# Patient Record
Sex: Male | Born: 1970 | Race: Black or African American | Hispanic: No | Marital: Married | Smoking: Never smoker
Health system: Southern US, Community
[De-identification: ages and names within clinical notes are randomized; demographics above are authoritative.]

## PROBLEM LIST (undated history)

## (undated) DIAGNOSIS — Z973 Presence of spectacles and contact lenses: Secondary | ICD-10-CM

## (undated) DIAGNOSIS — E785 Hyperlipidemia, unspecified: Secondary | ICD-10-CM

## (undated) HISTORY — DX: Presence of spectacles and contact lenses: Z97.3

## (undated) HISTORY — DX: Hyperlipidemia, unspecified: E78.5

## (undated) HISTORY — PX: VASECTOMY: SHX75

---

## 2005-02-25 ENCOUNTER — Emergency Department (HOSPITAL_COMMUNITY): Admission: EM | Admit: 2005-02-25 | Discharge: 2005-02-25 | Payer: Self-pay | Admitting: Emergency Medicine
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
## 2009-02-14 ENCOUNTER — Emergency Department (HOSPITAL_COMMUNITY): Admission: EM | Admit: 2009-02-14 | Discharge: 2009-02-15 | Payer: Self-pay | Admitting: Emergency Medicine
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
## 2013-06-01 ENCOUNTER — Emergency Department (INDEPENDENT_AMBULATORY_CARE_PROVIDER_SITE_OTHER)
Admission: EM | Admit: 2013-06-01 | Discharge: 2013-06-01 | Disposition: A | Discharge status: Home / Self Care | Payer: 59 | Source: Home / Self Care

## 2013-06-01 ENCOUNTER — Encounter (HOSPITAL_COMMUNITY): Payer: Self-pay | Admitting: Emergency Medicine

## 2013-06-01 DIAGNOSIS — S139XXA Sprain of joints and ligaments of unspecified parts of neck, initial encounter: Secondary | ICD-10-CM

## 2013-06-01 DIAGNOSIS — S161XXA Strain of muscle, fascia and tendon at neck level, initial encounter: Secondary | ICD-10-CM

## 2013-06-01 MED ORDER — CYCLOBENZAPRINE HCL 10 MG PO TABS: 10.0000 mg | ORAL_TABLET | Freq: Two times a day (BID) | ORAL | Status: DC | PRN

## 2013-06-01 MED ORDER — TRAMADOL HCL 50 MG PO TABS: 50.0000 mg | ORAL_TABLET | Freq: Four times a day (QID) | ORAL | Status: DC | PRN

## 2013-06-01 NOTE — ED Provider Notes (Signed)
Medical screening examination/treatment/procedure(s) were performed by non-physician practitioner and as supervising physician I was immediately available for consultation/collaboration.  Ranette Luckadoo, M.D.  Deklin Bieler C Jesiel Garate, MD 06/01/13 2213 

## 2013-06-01 NOTE — ED Notes (Signed)
Pain in neck for 1 week. No known cause of pain. Pain comes and goes, worse when tries to move neck. Has taken some medication that was left over from another problem, unsure of med name

## 2013-06-01 NOTE — ED Provider Notes (Signed)
CSN: [id number]     Arrival date & time 06/01/13  1316 History   None    Chief Complaint  Patient presents with  . Neck Pain   (Consider location/radiation/quality/duration/timing/severity/associated sxs/prior Treatment)  HPI  Since a 43 year old male presenting today with reports of neck pain for approximately one week she denies any known injury. States it has "gotten worse throughout the week".  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 Denies any numbness, weakness, or tingling.  History reviewed. No pertinent past medical history. History reviewed. No pertinent past surgical history. History reviewed. No pertinent family history. History  Substance Use Topics  . Smoking status: Never Smoker   . Smokeless tobacco: Not on file  . Alcohol Use: Yes    Review of Systems  Constitutional: Negative.   HENT: Negative.   Eyes: Negative.   Respiratory: Negative.   Cardiovascular: Negative.   Gastrointestinal: Negative.   Endocrine: Negative.   Genitourinary: Negative.   Musculoskeletal: Positive for back pain, myalgias and neck pain. Negative for arthralgias, gait problem, joint swelling and neck stiffness.  Skin: Negative.   Allergic/Immunologic: Negative.   Neurological: Negative.  Negative for dizziness, tremors, weakness, light-headedness, numbness and headaches.  Hematological: Negative.   Psychiatric/Behavioral: Negative.     Allergies  Review of patient's allergies indicates no known allergies.  Home Medications   Current Outpatient Rx  Name  Route  Sig  Dispense  Refill  . cyclobenzaprine (FLEXERIL) 10 MG tablet   Oral   Take 1 tablet (10 mg total) by mouth 2 (two) times daily as needed for muscle spasms.   20 tablet   0   . traMADol (ULTRAM) 50 MG tablet   Oral   Take 1 tablet (50 mg total) by mouth every 6 (six) hours as needed.   15 tablet   0    BP 141/88  Pulse 88  Temp(Src) 98.3 F (36.8 C) (Oral)   Resp 16  SpO2 98%  Physical Exam  Nursing note and vitals reviewed. Constitutional: He is oriented to person, place, and time. He appears well-developed and well-nourished. He appears distressed.  HENT:  Head: Normocephalic and atraumatic.  Eyes: Pupils are equal, round, and reactive to light. Right eye exhibits no discharge. Left eye exhibits no discharge. No scleral icterus.  Neck: Normal range of motion. Neck supple.  No nuchal rigidity.  Cardiovascular: Normal rate, regular rhythm, normal heart sounds and intact distal pulses.  Exam reveals no gallop and no friction rub.   No murmur heard. Pulmonary/Chest: Effort normal and breath sounds normal. No respiratory distress. He has no wheezes. He has no rales. He exhibits no tenderness.  Musculoskeletal: Normal range of motion. He exhibits no edema and no tenderness.       Cervical back: He exhibits normal range of motion, no tenderness, no bony tenderness, no swelling, no edema, no deformity, no laceration, no pain, no spasm and normal pulse.       Back:  Discomfort is noted with movement particularly flexion and extension.  Patient has full active and passive range of motion.  Negative Spurling's.  No discomfort over bony prominences.  Mild tenderness is reported over musculature. Disomfort radiates from neck and bilateral shoulders 2 central back and subscapular area.  Strength 5 / 5 all extremities.  Lymphadenopathy:    He has no cervical adenopathy.  Neurological: He is alert and oriented to person, place, and time. He has normal reflexes. He  displays normal reflexes. No cranial nerve deficit or sensory deficit. He exhibits normal muscle tone. Coordination normal.  Reflex Scores:      Bicep reflexes are 2+ on the right side and 2+ on the left side.      Brachioradialis reflexes are 2+ on the right side and 2+ on the left side.      Patellar reflexes are 2+ on the right side and 2+ on the left side.      Achilles reflexes are 2+ on  the right side and 2+ on the left side. Nerves II through XII grossly intact. Vibratory sensation intact to all extremities. Cap refill less than 2 seconds.   Skin: Skin is warm and dry. No rash noted. He is not diaphoretic. No erythema. No pallor.  Psychiatric: He has a normal mood and affect. His behavior is normal.    ED Course  Procedures (including critical care time) Labs Review Labs Reviewed - No data to display Imaging Review No results found.    MDM   1. Neck muscle strain    Meds ordered this encounter  Medications  . traMADol (ULTRAM) 50 MG tablet    Sig: Take 1 tablet (50 mg total) by mouth every 6 (six) hours as needed.    Dispense:  15 tablet    Refill:  0  . cyclobenzaprine (FLEXERIL) 10 MG tablet    Sig: Take 1 tablet (10 mg total) by mouth 2 (two) times daily as needed for muscle spasms.    Dispense:  20 tablet    Refill:  0   The patient advised to take Aleve twice daily for approximately one week.  Patient show neck stretching exercises and advised to stretch neck and back musculature frequently throughout the day to prevent reoccur ance.  Addition the patient to use ice for comfort. He is in verbalizes understanding of plan of care and followup with specialist or primary care provider if symptoms fail to improve or worsen.     Weber Cooks, NP 06/01/13 1620

## 2013-06-01 NOTE — Discharge Instructions (Signed)
Torticollis, Acute °You have suddenly (acutely) developed a twisted neck (torticollis). This is usually a self-limited condition. °CAUSES  °Acute torticollis may be caused by malposition, trauma or infection. Most commonly, acute torticollis is caused by sleeping in an awkward position. Torticollis may also be caused by the flexion, extension or twisting of the neck muscles beyond their normal position. Sometimes, the exact cause may not be known. °SYMPTOMS  °Usually, there is pain and limited movement of the neck. Your neck may twist to one side. °DIAGNOSIS  °The diagnosis is often made by physical examination. X-rays, CT scans or MRIs may be done if there is a history of trauma or concern of infection. °TREATMENT  °For a common, stiff neck that develops during sleep, treatment is focused on relaxing the contracted neck muscle. Medications (including shots) may be used to treat the problem. Most cases resolve in several days. Torticollis usually responds to conservative physical therapy. If left untreated, the shortened and spastic neck muscle can cause deformities in the face and neck. Rarely, surgery is required. °HOME CARE INSTRUCTIONS  °· Use over-the-counter and prescription medications as directed by your caregiver. °· Do stretching exercises and massage the neck as directed by your caregiver. °· Follow up with physical therapy if needed and as directed by your caregiver. °SEEK IMMEDIATE MEDICAL CARE IF:  °· You develop difficulty breathing or noisy breathing (stridor). °· You drool, develop trouble swallowing or have pain with swallowing. °· You develop numbness or weakness in the hands or feet. °· You have changes in speech or vision. °· You have problems with urination or bowel movements. °· You have difficulty walking. °· You have a fever. °· You have increased pain. °MAKE SURE YOU:  °· Understand these instructions. °· Will watch your condition. °· Will get help right away if you are not doing well or  get worse. °Document Released: 04/29/2000 Document Revised: 07/25/2011 Document Reviewed: 06/10/2009 °Exit[employer]® Patient Information ©2014 [employer]. ° °

## 2019-10-08 ENCOUNTER — Ambulatory Visit: Payer: 59 | Admitting: Medical

## 2019-10-08 ENCOUNTER — Encounter: Payer: Self-pay | Admitting: Medical

## 2019-10-08 ENCOUNTER — Other Ambulatory Visit: Payer: Self-pay

## 2019-10-08 VITALS — BP 122/82 | HR 87 | Ht 68.0 in | Wt 214.0 lb

## 2019-10-08 DIAGNOSIS — L723 Sebaceous cyst: Secondary | ICD-10-CM | POA: Diagnosis not present

## 2019-10-08 DIAGNOSIS — Z1211 Encounter for screening for malignant neoplasm of colon: Secondary | ICD-10-CM | POA: Diagnosis not present

## 2019-10-08 NOTE — Progress Notes (Signed)
Subjective: Chief Complaint  Patient presents with  . New Patient (Initial Visit)    establish care-ingrown hair    Here as a new patient to establish care.  Prior PCP retired  Concern for lump/ingrown hair right cheek x 3 years.  Has gotten a little bigger over time.  Gets whitish cheesy material out of the lesion.  He has questions about cancer screening.  No other aggravating or relieving factors.    No other c/o.  The following portions of the patient's history were reviewed and updated as appropriate: allergies, current medications, past family history, past medical history, past social history, past surgical history and problem list.  ROS Otherwise as in subjective above    Objective: BP 122/82   Pulse 87   Ht 5\' 8"  (1.727 m)   Wt 214 lb (97.1 kg)   SpO2 96%   BMI 32.54 kg/m   General appearance: alert, no distress, well developed, well nourished Right cheek with 3 cm diameter subcutaneous mass with central pore suggestive of sebaceous cyst, no erythema, no fluctuance, nontender    Assessment: Encounter Diagnoses  Name Primary?  . Sebaceous cyst Yes  . Screen for colon cancer      Plan: Sebaceous cyst - referral to dermatology for excision  discussed colon cancer screening.  He will check insurance coverage  F/u soon for fasting physical  Nathan Kirk was seen today for new patient (initial visit).  Diagnoses and all orders for this visit:  Sebaceous cyst -     Ambulatory referral to Dermatology  Screen for colon cancer    Follow up: soon for fasting physical

## 2019-10-08 NOTE — Progress Notes (Signed)
Done

## 2019-10-08 NOTE — Patient Instructions (Addendum)
We will refer you to dermatology to resolve the sebaceous cyst of face  Please call your insurance company to see if they cover screening colonoscopy or Cologard colon cancer test.  Schedule a fasting physical visit.        Epidermal Cyst  An epidermal cyst is a small, painless lump under your skin. The cyst contains a grayish-white, bad-smelling substance (keratin). Do not try to pop or open an epidermal cyst yourself. What are the causes?  A blocked hair follicle.  A hair that curls and re-enters the skin instead of growing straight out of the skin.  A blocked pore.  Irritated skin.  An injury to the skin.  Certain conditions that are passed along from parent to child (inherited).  Human papillomavirus (HPV).  Long-term sun damage to the skin. What increases the risk?  Having acne.  Being overweight.  Being 52-53 years old. What are the signs or symptoms? These cysts are usually harmless, but they can get infected. Symptoms of infection may include:  Redness.  Inflammation.  Tenderness.  Warmth.  Fever.  A grayish-white, bad-smelling substance drains from the cyst.  Pus drains from the cyst. How is this treated? In many cases, epidermal cysts go away on their own without treatment. If a cyst becomes infected, treatment may include:  Opening and draining the cyst, done by a doctor. After draining, you may need minor surgery to remove the rest of the cyst.  Antibiotic medicine.  Shots of medicines (steroids) that help to reduce inflammation.  Surgery to remove the cyst. Surgery may be done if the cyst: ? Becomes large. ? Bothers you. ? Has a chance of turning into cancer.  Do not try to open a cyst yourself. Follow these instructions at home:  Take over-the-counter and prescription medicines only as told by your doctor.  If you were prescribed an antibiotic medicine, take it it as told by your doctor. Do not stop using the antibiotic even if you  start to feel better.  Keep the area around your cyst clean and dry.  Wear loose, dry clothing.  Avoid touching your cyst.  Check your cyst every day for signs of infection. Check for: ? Redness, swelling, or pain. ? Fluid or blood. ? Warmth. ? Pus or a bad smell.  Keep all follow-up visits as told by your doctor. This is important. How is this prevented?  Wear clean, dry, clothing.  Avoid wearing tight clothing.  Keep your skin clean and dry. Take showers or baths every day. Contact a doctor if:  Your cyst has symptoms of infection.  Your condition does not improve or gets worse.  You have a cyst that looks different from other cysts you have had.  You have a fever. Get help right away if:  Redness spreads from the cyst into the area close by. Summary  An epidermal cyst is a sac made of skin tissue.  If a cyst becomes infected, treatment may include surgery to open and drain the cyst, or to remove it.  Take over-the-counter and prescription medicines only as told by your doctor.  Contact a doctor if your condition is not improving or is getting worse.  Keep all follow-up visits as told by your doctor. This is important. This information is not intended to replace advice given to you by your health care provider. Make sure you discuss any questions you have with your health care provider. Document Revised: 08/23/2018 Document Reviewed: 02/08/2018 Elsevier Patient Education  2020 [employer]  Inc.  

## 2019-11-11 ENCOUNTER — Telehealth: Payer: Self-pay

## 2019-11-11 ENCOUNTER — Encounter: Payer: 59 | Admitting: Medical

## 2019-11-11 NOTE — Telephone Encounter (Signed)
D

## 2019-11-11 NOTE — Telephone Encounter (Signed)

## 2019-11-12 NOTE — Telephone Encounter (Signed)
Letter has been sent asking patient to call and reschedule the appointment.

## 2019-11-13 ENCOUNTER — Encounter: Payer: Self-pay | Admitting: Medical

## 2020-01-08 ENCOUNTER — Encounter: Payer: Self-pay | Admitting: Plastic Surgery

## 2020-01-08 ENCOUNTER — Ambulatory Visit (INDEPENDENT_AMBULATORY_CARE_PROVIDER_SITE_OTHER): Payer: 59 | Admitting: Plastic Surgery

## 2020-01-08 ENCOUNTER — Other Ambulatory Visit: Payer: Self-pay

## 2020-01-08 VITALS — BP 132/86 | HR 67 | Temp 98.8°F | Ht 68.0 in | Wt 214.0 lb

## 2020-01-08 DIAGNOSIS — L989 Disorder of the skin and subcutaneous tissue, unspecified: Secondary | ICD-10-CM

## 2020-01-08 NOTE — Progress Notes (Signed)
   Referring Provider Tysinger, Kermit Balo, PA-C [address] [place],  [place] [address]   CC:  Chief Complaint  Patient presents with  . Consult      Nathan Kirk is an 49 y.o. male.  HPI: Patient presents complaining of a cyst of his right cheek.  Its been there for a number of years and recently got bigger.  He was able to express quite a bit of fluid from it and has since gotten a bit smaller.  There is still a punctum present and it still intermittently tender for him and he would like it to be excised.  Allergies  Allergen Reactions  . Chloroquine Rash    No outpatient encounter medications on file as of 01/08/2020.   No facility-administered encounter medications on file as of 01/08/2020.     Past Medical History:  Diagnosis Date  . Hyperlipidemia   . Wears glasses     Past Surgical History:  Procedure Laterality Date  . VASECTOMY      Family History  Problem Relation Age of Onset  . Cancer Mother        breast  . Kidney disease Father        died of kidney failure  . Stroke Brother   . Hypertension Brother   . Hypertension Brother   . Alcohol abuse Brother     Social History   Social History Narrative   Lives with 4 of his children, separated, owns [employer].  Exercise - active at work, on the move.  [place] originally.  09/2019     Review of Systems General: Denies fevers, chills, weight loss CV: Denies chest pain, shortness of breath, palpitations  Physical Exam Vitals with BMI 01/08/2020 10/08/2019 06/01/2013  Height 5\' 8"  5\' 8"  -  Weight 214 lbs 214 lbs -  BMI 32.55 32.55 -  Systolic 132 122  Diastolic 86 82 88  Pulse 67 87 88    General:  No acute distress,  Alert and oriented, Non-Toxic, Normal speech and affect Examination shows a 2.5 cm cystic lesion in the right cheek.  There is a punctum present.  Unable to feel it subcutaneously.  Other than the punctum there is no other overlying skin changes.  Assessment/Plan Patient  presents with likely sebaceous cyst in the right cheek.  We discussed excision.  We discussed the risk that include bleeding, infection, damage to surrounding structures, need for additional procedures.  We discussed the potential for recurrence.  He is interested in moving forward we will plan to excise this in the office.  01/08/2020, 3:04 PM

## 2020-03-04 ENCOUNTER — Other Ambulatory Visit: Payer: Self-pay

## 2020-03-04 ENCOUNTER — Ambulatory Visit (INDEPENDENT_AMBULATORY_CARE_PROVIDER_SITE_OTHER): Payer: 59 | Admitting: Plastic Surgery

## 2020-03-04 ENCOUNTER — Telehealth: Payer: Self-pay | Admitting: Plastic Surgery

## 2020-03-04 ENCOUNTER — Encounter: Payer: Self-pay | Admitting: Plastic Surgery

## 2020-03-04 ENCOUNTER — Other Ambulatory Visit (HOSPITAL_COMMUNITY)
Admission: RE | Admit: 2020-03-04 | Discharge: 2020-03-04 | Disposition: A | Discharge status: Home / Self Care | Payer: 59 | Source: Ambulatory Visit | Attending: Plastic Surgery | Admitting: Plastic Surgery

## 2020-03-04 VITALS — BP 118/77 | HR 77 | Temp 98.5°F

## 2020-03-04 DIAGNOSIS — L723 Sebaceous cyst: Secondary | ICD-10-CM | POA: Diagnosis not present

## 2020-03-04 NOTE — Progress Notes (Signed)
Operative Note   DATE OF OPERATION: 03/04/2020  LOCATION:    SURGICAL DEPARTMENT: Plastic Surgery  PREOPERATIVE DIAGNOSES:  Right cheek cyst  POSTOPERATIVE DIAGNOSES:  same  PROCEDURE:  1. Excision of right cheek cyst measuring 3 cm 2. Complex closure measuring 3 cm  SURGEON: Ancil Linsey, MD  ANESTHESIA:  Local  COMPLICATIONS: None.   INDICATIONS FOR PROCEDURE:  The patient, Nathan Kirk is a 49 y.o. male born on [birth year], is here for treatment of right cheek cyst. MRN: [id number]  CONSENT:  Informed consent was obtained directly from the patient. Risks, benefits and alternatives were fully discussed. Specific risks including but not limited to bleeding, infection, hematoma, seroma, scarring, pain, infection, wound healing problems, and need for further surgery were all discussed. The patient did have an ample opportunity to have questions answered to satisfaction.   DESCRIPTION OF PROCEDURE:  Local anesthesia was administered. The patient's operative site was prepped and draped in a sterile fashion. A time out was performed and all information was confirmed to be correct.  The lesion was excised with a 15 blade.  Hemostasis was obtained.  Circumferential undermining was performed and the skin was advanced and closed in layers with interrupted buried Monocryl sutures and 5-0 fast gut for the skin.  The lesion excised measured 3 cm, and the total length of closure measured 3 cm.    The patient tolerated the procedure well.  There were no complications.

## 2020-03-04 NOTE — Telephone Encounter (Signed)
Patient's wife, Bonita Quin, called to find out what the post procedure instructions are. Please call her number [phone] to advise.

## 2020-03-06 LAB — SURGICAL PATHOLOGY

## 2020-03-06 NOTE — Telephone Encounter (Signed)
Tried calling the patient's wife back on (03/04/20) regarding her message below.  But did not get an answer.    Called again today and LMOM informing her that I have tried calling her back but did not get an answer.  But I spoke with Dr. Arita Miss and he stated that they can clean the area, and cover it up if they would like to.//AB/CMA

## 2020-03-25 ENCOUNTER — Encounter: Payer: Self-pay | Admitting: Plastic Surgery

## 2020-03-25 ENCOUNTER — Other Ambulatory Visit: Payer: Self-pay

## 2020-03-25 ENCOUNTER — Ambulatory Visit (INDEPENDENT_AMBULATORY_CARE_PROVIDER_SITE_OTHER): Payer: 59 | Admitting: Plastic Surgery

## 2020-03-25 VITALS — BP 129/74 | HR 94 | Temp 98.2°F

## 2020-03-25 DIAGNOSIS — L723 Sebaceous cyst: Secondary | ICD-10-CM

## 2020-03-25 NOTE — Progress Notes (Signed)
Patient is here postop from excision of a cyst on his right cheek.  He is overall doing well no complaints.  On exam the incision is healing fine with no issues.  It looks like this will ultimately heal nicely.  All of his questions were answered and we will plan to see him again on an as-needed basis.  The pathology was reviewed with him and is benign.

## 2023-08-01 ENCOUNTER — Encounter: Payer: Self-pay | Admitting: Family Medicine

## 2023-08-01 ENCOUNTER — Ambulatory Visit (INDEPENDENT_AMBULATORY_CARE_PROVIDER_SITE_OTHER): Payer: Self-pay | Admitting: Family Medicine

## 2023-08-01 VITALS — BP 126/79 | HR 92 | Ht 68.0 in | Wt 219.8 lb

## 2023-08-01 DIAGNOSIS — L720 Epidermal cyst: Secondary | ICD-10-CM | POA: Diagnosis not present

## 2023-08-01 DIAGNOSIS — E559 Vitamin D deficiency, unspecified: Secondary | ICD-10-CM | POA: Diagnosis not present

## 2023-08-01 DIAGNOSIS — Z6833 Body mass index (BMI) 33.0-33.9, adult: Secondary | ICD-10-CM

## 2023-08-01 DIAGNOSIS — Z7689 Persons encountering health services in other specified circumstances: Secondary | ICD-10-CM

## 2023-08-01 DIAGNOSIS — E785 Hyperlipidemia, unspecified: Secondary | ICD-10-CM | POA: Insufficient documentation

## 2023-08-01 DIAGNOSIS — Z1211 Encounter for screening for malignant neoplasm of colon: Secondary | ICD-10-CM

## 2023-08-01 DIAGNOSIS — Z0000 Encounter for general adult medical examination without abnormal findings: Secondary | ICD-10-CM

## 2023-08-01 DIAGNOSIS — R030 Elevated blood-pressure reading, without diagnosis of hypertension: Secondary | ICD-10-CM | POA: Insufficient documentation

## 2023-08-01 DIAGNOSIS — Z1212 Encounter for screening for malignant neoplasm of rectum: Secondary | ICD-10-CM

## 2023-08-01 DIAGNOSIS — Z125 Encounter for screening for malignant neoplasm of prostate: Secondary | ICD-10-CM

## 2023-08-01 NOTE — Assessment & Plan Note (Signed)
 Looks like this was surgically excised in 2021 and this showed epidermoid inclusion cyst.  He has since had a case of the lesion.  Advised him to reach out to this dermatologist who originally saw him for this and just to schedule appointment and if he needs a referral from me again to let us know

## 2023-08-01 NOTE — Assessment & Plan Note (Signed)
 Patient had a vitamin D level of 17 a year ago.  Not on any medications.  Repeat this today.

## 2023-08-01 NOTE — Patient Instructions (Addendum)
 It was nice to see you today,  We addressed the following topics today: -I am sending in a referral to the gastroenterologist.  Someone should call you to schedule colonoscopy.  If he has not heard from somebody in 3 weeks please let us know - I am going to order some blood test.  You will need to schedule your lab visit upfront. - If you want to have the lump on your right cheek removed again I would talk to the same dermatologist who did the previous removal and let them know you want to see them again.  If they need another referral let us know and we can put in the referral. - I would like you to check your blood pressure at home twice a day for the next 2 weeks and bring back the results to [place].  Have a great day,  Frederic Jericho, MD

## 2023-08-01 NOTE — Progress Notes (Signed)
 New Patient Office Visit  Subjective   Patient ID: Nathan Kirk, male    DOB: October 18, 1970  Age: 53 y.o. MRN: [id number]  CC:  Chief Complaint  Patient presents with   New Patient (Initial Visit)    HPI Mouhamed Glassco presents to establish care Patient [facility] but only went there 1 time.  Patient not currently on any medications.  Has no concerns.  Would like a colonoscopy referral.   PMH: hld, vit d  PSH: Excision of facial cyst  FH: father side - dm and htn.  Brother - htn.  Mother - breast cancer.    Tobacco use: no Alcohol use: beer occasional.   Drug use: no Marital status: married.  4 children.  24 , 19, 17, 10.   Employment: self employed - trucking.    Screenings:  Colon Cancer: never done.   Prostate: psa   No outpatient encounter medications on file as of 08/01/2023.   No facility-administered encounter medications on file as of 08/01/2023.    Past Medical History:  Diagnosis Date   Hyperlipidemia    Wears glasses     Past Surgical History:  Procedure Laterality Date   VASECTOMY      Family History  Problem Relation Age of Onset   Cancer Mother        breast   Kidney disease Father        died of kidney failure   Stroke Brother    Hypertension Brother    Hypertension Brother    Alcohol abuse Brother     Social History   Socioeconomic History   Marital status: Married    Spouse name: Not on file   Number of children: Not on file   Years of education: Not on file   Highest education level: Not on file  Occupational History   Not on file  Tobacco Use   Smoking status: Never   Smokeless tobacco: Never  Substance and Sexual Activity   Alcohol use: Yes   Drug use: Never   Sexual activity: Not on file  Other Topics Concern   Not on file  Social History Narrative   Lives with 4 of 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  Exercise - active at work, on the move.  [place] originally.  09/2019    Social Drivers of Corporate investment banker Strain: Not on file  Food Insecurity: Not on file  Transportation Needs: Not on file  Physical Activity: Not on file  Stress: Not on file  Social Connections: Not on file  Intimate Partner Violence: Not on file    ROS     Objective   BP (!) 145/89   Pulse 92   Ht 5\' 8"  (1.727 m)   Wt 219 lb 12.8 oz (99.7 kg)   SpO2 95%   BMI 33.42 kg/m   Physical Exam General: Alert, oriented HEENT: PERRLA, EOMI, moist mucosa.  Subcutaneous cyst on the right cheek.  Nontender, mobile. CV: Rate rhythm Pulmonary: Lungs clear bilaterally GI: Soft, normal bowel sounds MSK: Normal gait, strength equal bilaterally Psych: Pleasant affect Skin: Warm and dry     Assessment & Plan:   Encounter to establish care  Vitamin D deficiency Assessment & Plan: Patient had a vitamin D level of 17 a year ago.  Not on any medications.  Repeat this today.  Orders: -     VITAMIN D 25 Hydroxy (Vit-D Deficiency, Fractures); Future  Prostate cancer screening -  PSA; Future  Encounter for colorectal cancer screening -     Ambulatory referral to Gastroenterology  Hyperlipidemia, unspecified hyperlipidemia type Assessment & Plan: LDL of 173 last year.  Not on medications.  Repeating lipid panel today.   Epidermoid cyst of face Assessment & Plan: Looks like this was surgically excised in 2021 and this showed epidermoid inclusion cyst.  He has since had a case of the lesion.  Advised him to reach out to this dermatologist who originally saw him for this and just to schedule appointment and if he needs a referral from me again to let us know   Elevated BP without diagnosis of hypertension Assessment & Plan: Elevated today and again on repeat.  No previous diagnosis of hypertension.  Provided patient with blood pressure log and advised him to check blood pressure twice a day for the next 2 weeks and return the sheet to [place].   BMI  33.0-33.9,adult -     Hemoglobin A1c; Future -     Lipid panel; Future -     Comprehensive metabolic panel    Return in about 1 year (around 07/31/2024) for physical.   Sandre Kitty, MD

## 2023-08-01 NOTE — Assessment & Plan Note (Signed)
 LDL of 173 last year.  Not on medications.  Repeating lipid panel today.

## 2023-08-01 NOTE — Assessment & Plan Note (Signed)
 Elevated today and again on repeat.  No previous diagnosis of hypertension.  Provided patient with blood pressure log and advised him to check blood pressure twice a day for the next 2 weeks and return the sheet to [place].

## 2023-08-04 ENCOUNTER — Other Ambulatory Visit

## 2023-08-04 DIAGNOSIS — E559 Vitamin D deficiency, unspecified: Secondary | ICD-10-CM

## 2023-08-04 DIAGNOSIS — Z6833 Body mass index (BMI) 33.0-33.9, adult: Secondary | ICD-10-CM

## 2023-08-04 DIAGNOSIS — Z125 Encounter for screening for malignant neoplasm of prostate: Secondary | ICD-10-CM

## 2023-08-05 LAB — LIPID PANEL
Chol/HDL Ratio: 4.5 ratio (ref 0.0–5.0)
Cholesterol, Total: 225 mg/dL — ABNORMAL HIGH (ref 100–199)
HDL: 50 mg/dL (ref >39)
LDL Chol Calc (NIH): 158 mg/dL — ABNORMAL HIGH (ref 0–99)
Triglycerides: 94 mg/dL (ref 0–149)
VLDL Cholesterol Cal: 17 mg/dL (ref 5–40)

## 2023-08-05 LAB — COMPREHENSIVE METABOLIC PANEL
ALT: 24 IU/L (ref 0–44)
AST: 19 IU/L (ref 0–40)
Albumin: 4.5 g/dL (ref 4.1–5.1)
Alkaline Phosphatase: 86 IU/L (ref 44–121)
BUN/Creatinine Ratio: 11 (ref 9–20)
BUN: 10 mg/dL (ref 6–24)
Bilirubin Total: 0.5 mg/dL (ref 0.0–1.2)
CO2: 27 mmol/L (ref 20–29)
Calcium: 8.9 mg/dL (ref 8.7–10.2)
Chloride: 106 mmol/L (ref 96–106)
Creatinine, Ser: 0.91 mg/dL (ref 0.76–1.27)
Globulin, Total: 2.5 g/dL (ref 1.5–4.5)
Glucose: 109 mg/dL — ABNORMAL HIGH (ref 70–99)
Potassium: 3.3 mmol/L — ABNORMAL LOW (ref 3.5–5.2)
Sodium: 146 mmol/L — ABNORMAL HIGH (ref 134–144)
Total Protein: 7 g/dL (ref 6.0–8.5)
eGFR: 103 mL/min/{1.73_m2} (ref >59)

## 2023-08-05 LAB — VITAMIN D 25 HYDROXY (VIT D DEFICIENCY, FRACTURES): Vit D, 25-Hydroxy: 13.4 ng/mL — ABNORMAL LOW (ref 30.0–100.0)

## 2023-08-05 LAB — PSA: Prostate Specific Ag, Serum: 0.4 ng/mL (ref 0.0–4.0)

## 2023-08-05 LAB — HEMOGLOBIN A1C
Est. average glucose Bld gHb Est-mCnc: 100 mg/dL
Hgb A1c MFr Bld: 5.1 % (ref 4.8–5.6)

## 2023-08-08 ENCOUNTER — Other Ambulatory Visit: Payer: Self-pay | Admitting: Family Medicine

## 2023-08-08 ENCOUNTER — Encounter: Payer: Self-pay | Admitting: Family Medicine

## 2023-08-08 MED ORDER — VITAMIN D (ERGOCALCIFEROL) 1.25 MG (50000 UNIT) PO CAPS: 50000.0000 [IU] | ORAL_CAPSULE | ORAL | 0 refills | Status: AC

## 2023-08-30 ENCOUNTER — Encounter: Payer: Self-pay | Admitting: Plastic Surgery

## 2023-08-30 ENCOUNTER — Ambulatory Visit: Admitting: Plastic Surgery

## 2023-08-30 VITALS — BP 139/84 | HR 87 | Ht 68.0 in | Wt 232.2 lb

## 2023-08-30 DIAGNOSIS — D489 Neoplasm of uncertain behavior, unspecified: Secondary | ICD-10-CM

## 2023-08-30 DIAGNOSIS — L989 Disorder of the skin and subcutaneous tissue, unspecified: Secondary | ICD-10-CM

## 2023-08-30 DIAGNOSIS — R220 Localized swelling, mass and lump, head: Secondary | ICD-10-CM

## 2023-08-30 NOTE — Progress Notes (Signed)
   Referring Provider Sandre Kitty, MD [address] [place],  [place] [address]   CC:  Chief Complaint  Patient presents with   consult      Nathan Kirk is an 53 y.o. male.  HPI: Nathan Kirk presents today with complaints of a mass right lateral cheek.  Patient underwent excision of a mass in the same area approximately 4 years ago.  He states that after the first year he had what appeared to be an increase in the size that he thought was due to sutures or scar tissue.  The mass has continued to increase in size since then.  He would like to have it removed.  Pathology from that procedure was consistent with an epidermal inclusion cyst.  Allergies  Allergen Reactions   Chloroquine Rash    Outpatient Encounter Medications as of 08/30/2023  Medication Sig   Vitamin D, Ergocalciferol, (DRISDOL) 1.25 MG (50000 UNIT) CAPS capsule Take 1 capsule (50,000 Units total) by mouth every 7 (seven) days.   No facility-administered encounter medications on file as of 08/30/2023.     Past Medical History:  Diagnosis Date   Hyperlipidemia    Wears glasses     Past Surgical History:  Procedure Laterality Date   VASECTOMY      Family History  Problem Relation Age of Onset   Cancer Mother        breast   Kidney disease Father        died of kidney failure   Stroke Brother    Hypertension Brother    Hypertension Brother    Alcohol abuse Brother     Social History   Social History Narrative   Lives with 4 of his children, separated, owns [employer].  Exercise - active at work, on the move.  [place] originally.  09/2019     Review of Systems General: Denies fevers, chills, weight loss CV: Denies chest pain, shortness of breath, palpitations Mass, right cheek: Mass of slowly increasing size without pain or drainage  Physical Exam    08/30/2023    1:31 PM 08/01/2023    2:02 PM 08/01/2023    1:37 PM  Vitals with BMI  Height 5\' 8"   5\' 8"   Weight 232 lbs 3 oz  219  lbs 13 oz  BMI 35.31  33.43  Systolic 139 145 604  Diastolic 84 89 91  Pulse 87  92    General:  No acute distress,  Alert and oriented, Non-Toxic, Normal speech and affect Mass, right cheek: Patient has a 2 cm mass over the right cheek at the mandibular border which is firm but mobile.  There is a scar over the center of the mass.  There is no drainage and no erythema today  Assessment/Plan Mass, right cheek: Patient has a mass which appears to be a recurrent epidermal inclusion cyst.  I have discussed removing this in the office under local with him.  He is agreeable.  We again discussed the risks of bleeding, infection, and recurrence.  He also understands there is a slight chance of damage to the surrounding structures.  He would like to proceed.  Photographs were obtained today with his consent.  Will schedule at his request.  [name] 08/30/2023, 1:57 PM

## 2023-09-07 ENCOUNTER — Ambulatory Visit (HOSPITAL_COMMUNITY)
Admission: EM | Admit: 2023-09-07 | Discharge: 2023-09-07 | Disposition: A | Discharge status: Home / Self Care | Attending: Family Medicine | Admitting: Family Medicine

## 2023-09-07 ENCOUNTER — Other Ambulatory Visit: Payer: Self-pay

## 2023-09-07 ENCOUNTER — Encounter (HOSPITAL_COMMUNITY): Payer: Self-pay | Admitting: *Deleted

## 2023-09-07 DIAGNOSIS — T148XXA Other injury of unspecified body region, initial encounter: Secondary | ICD-10-CM | POA: Diagnosis not present

## 2023-09-07 MED ORDER — TIZANIDINE HCL 4 MG PO TABS: 4.0000 mg | ORAL_TABLET | Freq: Three times a day (TID) | ORAL | 0 refills | Status: AC | PRN

## 2023-09-07 NOTE — Discharge Instructions (Signed)
 You were seen today for chest and abdominal pain.  I think this is likely caused by a deep muscle strain.   I have sent out a muscle relaxer.  This may  make you tired/drowsy so please take when home and not driving.  You may continue motrin as well.  If you develop nausea or vomiting or fevers, then please go to the ER for further evaluation.

## 2023-09-07 NOTE — ED Triage Notes (Signed)
 PT reports on Sat he picked up a 50lbs bow and twisted and now has pain on Lt side. Pt has tried Advil with some relief. Pt reports pain comes and goes.

## 2023-09-07 NOTE — ED Provider Notes (Signed)
 [facility]    CSN: [id number] Arrival date & time: 09/07/23  4098      History   Chief Complaint Chief Complaint  Patient presents with   Muscle Pain    HPI Nathan Kirk is a 53 y.o. male.    Muscle Pain Associated symptoms include abdominal pain.  Patient is here for pain to the left chest/rib area.  He lifted something heavy over the weekend (50lbs) and twisted.  About 2 hrs later he had pain. He had worsening pain the next day.   Today it is not as bad as previous, but still present.  Pain is at the left chest at times, left ribs, and sometimes into the abdomen; Usually will come on with movement, but is not consistent.  Taking advil for pain.  No n/v.  He did have some chills last night, so he came in today.        Past Medical History:  Diagnosis Date   Hyperlipidemia    Wears glasses     Patient Active Problem List   Diagnosis Date Noted   Vitamin D  deficiency 08/01/2023   Hyperlipidemia 08/01/2023   Epidermoid cyst of face 08/01/2023   Elevated BP without diagnosis of hypertension 08/01/2023    Past Surgical History:  Procedure Laterality Date   VASECTOMY         Home Medications    Prior to Admission medications   Medication Sig Start Date End Date Taking? Authorizing Provider  Vitamin D , Ergocalciferol , (DRISDOL ) 1.25 MG (50000 UNIT) CAPS capsule Take 1 capsule (50,000 Units total) by mouth every 7 (seven) days. 08/08/23   Laneta Pintos, MD    Family History Family History  Problem Relation Age of Onset   Cancer Mother        breast   Kidney disease Father        died of kidney failure   Stroke Brother    Hypertension Brother    Hypertension Brother    Alcohol abuse Brother     Social History Social History   Tobacco Use   Smoking status: Never   Smokeless tobacco: Never  Substance Use Topics   Alcohol use: Yes   Drug use: Never     Allergies   Chloroquine   Review of Systems Review of Systems   Constitutional: Negative.   HENT: Negative.    Respiratory: Negative.    Cardiovascular: Negative.   Gastrointestinal:  Positive for abdominal pain.  Genitourinary: Negative.   Musculoskeletal:  Positive for arthralgias.  Psychiatric/Behavioral: Negative.       Physical Exam Triage Vital Signs ED Triage Vitals  Encounter Vitals Group     BP 09/07/23 0835 (!) 133/90     Systolic BP Percentile --      Diastolic BP Percentile --      Pulse Rate 09/07/23 0835 78     Resp 09/07/23 0835 18     Temp 09/07/23 0835 98.2 F (36.8 C)     Temp src --      SpO2 09/07/23 0835 100 %     Weight --      Height --      Head Circumference --      Peak Flow --      Pain Score 09/07/23 0833 6     Pain Loc --      Pain Education --      Exclude from Growth Chart --    No data found.  Updated Vital Signs  BP (!) 133/90   Pulse 78   Temp 98.2 F (36.8 C)   Resp 18   SpO2 100%   Visual Acuity Right Eye Distance:   Left Eye Distance:   Bilateral Distance:    Right Eye Near:   Left Eye Near:    Bilateral Near:     Physical Exam Constitutional:      General: He is not in acute distress.    Appearance: Normal appearance. He is normal weight. He is not ill-appearing or toxic-appearing.  Cardiovascular:     Rate and Rhythm: Normal rate and regular rhythm.  Pulmonary:     Effort: Pulmonary effort is normal.     Breath sounds: Normal breath sounds. No wheezing or rhonchi.  Chest:     Chest wall: No tenderness.  Abdominal:     Palpations: Abdomen is soft.     Tenderness: There is no abdominal tenderness. There is no guarding or rebound.  Musculoskeletal:        General: No swelling, tenderness, deformity or signs of injury. Normal range of motion.     Cervical back: Normal range of motion and neck supple.  Skin:    General: Skin is warm.  Neurological:     General: No focal deficit present.     Mental Status: He is alert and oriented to person, place, and time.  Psychiatric:         Mood and Affect: Mood normal.      UC Treatments / Results  Labs (all labs ordered are listed, but only abnormal results are displayed) Labs Reviewed - No data to display  EKG   Radiology No results found.  Procedures Procedures (including critical care time)  Medications Ordered in UC Medications - No data to display  Initial Impression / Assessment and Plan / UC Course  I have reviewed the triage vital signs and the nursing notes.  Pertinent labs & imaging results that were available during my care of the patient were reviewed by me and considered in my medical decision making (see chart for details).   Final Clinical Impressions(s) / UC Diagnoses   Final diagnoses:  Muscle strain     Discharge Instructions      You were seen today for chest and abdominal pain.  I think this is likely caused by a deep muscle strain.   I have sent out a muscle relaxer.  This may  make you tired/drowsy so please take when home and not driving.  You may continue motrin as well.  If you develop nausea or vomiting or fevers, then please go to the ER for further evaluation.      ED Prescriptions     Medication Sig Dispense Auth. Provider   tiZANidine  (ZANAFLEX ) 4 MG tablet Take 1 tablet (4 mg total) by mouth every 8 (eight) hours as needed. 30 tablet Lesle Ras, MD      PDMP not reviewed this encounter.   Lesle Ras, MD 09/07/23 [phone]

## 2023-09-20 ENCOUNTER — Ambulatory Visit: Admitting: Plastic Surgery

## 2023-09-20 VITALS — BP 146/85 | HR 81

## 2023-09-20 DIAGNOSIS — L720 Epidermal cyst: Secondary | ICD-10-CM

## 2023-09-20 DIAGNOSIS — L989 Disorder of the skin and subcutaneous tissue, unspecified: Secondary | ICD-10-CM

## 2023-09-20 NOTE — Progress Notes (Signed)
 Procedure Note  Preoperative Dx: Right cheek cyst  Postoperative Dx: Same  Procedure: Excision of right cheek cyst  Anesthesia: Lidocaine 1% with 1:100,000 epinephrine and 0.25% Sensorcaine   Indication for Procedure: Removal for pathologic diagnosis  Description of Procedure: Risks and complications were explained to the patient including infection and recurrence.  Consent was confirmed and the patient understands the risks and benefits.  The potential complications and alternatives were explained and the patient consents.  The patient expressed understanding the option of not having the procedure and the risks of a scar.  Time out was called and all information was confirmed to be correct.    The area was prepped and drapped.  Local anesthetic was injected in the subcutaneous tissues.  After waiting for the local to take affect an elliptical incision was made around the previous scar.  Sharp and blunt dissection were used to isolate the mass which was removed with the cyst wall intact.  The mass measured approximately 2 cm in length after obtaining hemostasis, the surgical wound was closed with 4-0 Monocryl sutures in the subcutaneous tissue and interrupted 5-0 Prolene sutures in the skin.  The surgical wound measured 1.5 cm.  A dressing was applied.  The patient was given instructions on how to care for the area and a follow up appointment.  Nathan Kirk tolerated the procedure well and there were no complications. The specimen was sent to pathology.

## 2023-09-26 ENCOUNTER — Ambulatory Visit: Admitting: Student

## 2023-09-26 VITALS — BP 121/80 | HR 84

## 2023-09-26 DIAGNOSIS — L989 Disorder of the skin and subcutaneous tissue, unspecified: Secondary | ICD-10-CM

## 2023-09-26 LAB — DERMATOLOGY PATHOLOGY

## 2023-09-26 NOTE — Progress Notes (Signed)
 Patient is a 53 year old male with history of a right cheek cyst.  He recently underwent excision of the right cheek cyst with Dr. Carolynne Citron on 09/20/2023.  Patient is about 6 days out from his procedure.  He presents to the clinic today for postprocedural follow-up.  Lesion was sent to pathology, but results have not yet returned.  Today, patient reports that he is doing well.  He denies any issues with the procedure site.  He denies any drainage or fevers or chills.  On exam, patient is sitting upright in no acute distress.  Incision appears to be intact with Prolene sutures.  There is no surrounding erythema or swelling.  No fluctuance noted on exam.  Prolene sutures were removed without any difficulty.  Upon removing the most inferior Prolene suture, there was a small amount of cloudy drainage.  I was not able to express any further drainage.  I discussed with the patient that I would like him to closely monitor the area.  I discussed with him that if the area becomes red, swollen, painful, or notices any more drainage he should let us  know.  Patient expressed understanding.  Recommended that he apply Vaseline to his incision daily for the next week or so.  Discussed with him that after that, he may start silicone base scar creams such as Mederma with silicone or Silagen.  Also discussed with the patient to avoid direct sunlight as this can worsen the scar.  Patient expressed understanding.  Will do a telephone visit next week with the patient  I instructed him to call in the meantime if he has any questions or concerns about anything.  Pictures were obtained of the patient and placed in the chart with the patient's or guardian's permission.

## 2023-10-03 ENCOUNTER — Ambulatory Visit (INDEPENDENT_AMBULATORY_CARE_PROVIDER_SITE_OTHER): Admitting: Student

## 2023-10-03 DIAGNOSIS — L989 Disorder of the skin and subcutaneous tissue, unspecified: Secondary | ICD-10-CM

## 2023-10-03 NOTE — Progress Notes (Signed)
 Patient is a 53 year old male with history of a right cheek cyst.  He recently underwent excision of the right cheek cyst with Dr. Carolynne Citron on 09/20/2023.      Patient was seen in the clinic on 09/26/2023.  At that time, results of the pathology had not yet come back.  At this visit, patient was doing well.  Incision was intact with Prolene sutures.  There is no surrounding erythema or swelling.  There is no fluctuance on exam.  Upon removing the most inferior Prolene suture, there was a small amount of cloudy drainage.  Recommended that he closely monitor this.  Today, patient reports he is doing well.  He denies any drainage from his incision, fevers or chills.  He does report that the most inferior aspect of his incision is a little bit sensitive.  He denies any other issues or concerns.  The results of his pathology have come back and showed an epidermoid cyst.  He and I discussed the pathology, he expressed understanding.  I discussed with the patient that the sensitivity that he is experiencing may be due to nerves and may improve with time.  Recommended that he continue to monitor his incision.  Discussed with patient he may call us  if he has any further questions or concerns about anything.  The patient gave consent to have this visit done by telemedicine / virtual visit, two identifiers were used to identify patient. This is also consent for access the chart and treat the patient via this visit. The patient is located in [place] .  I, the provider, am at the office.  We spent 5 minutes together for the visit.  Joined by telephone.

## 2024-07-29 ENCOUNTER — Other Ambulatory Visit

## 2024-08-05 ENCOUNTER — Encounter: Admitting: Family Medicine
